# Patient Record
Sex: Female | Born: 1980 | Race: Black or African American | Hispanic: Yes | Marital: Single | Smoking: Current some day smoker
Health system: Southern US, Community
[De-identification: ages and names within clinical notes are randomized; demographics above are authoritative.]

## PROBLEM LIST (undated history)

## (undated) ENCOUNTER — Ambulatory Visit: Admission: EM | Payer: Medicaid Other

---

## 2017-04-08 ENCOUNTER — Encounter: Payer: Self-pay | Admitting: Emergency Medicine

## 2017-04-08 ENCOUNTER — Ambulatory Visit
Admission: EM | Admit: 2017-04-08 | Discharge: 2017-04-08 | Disposition: A | Discharge status: Home / Self Care | Payer: Self-pay | Attending: Family Medicine | Admitting: Family Medicine

## 2017-04-08 DIAGNOSIS — L739 Follicular disorder, unspecified: Secondary | ICD-10-CM

## 2017-04-08 DIAGNOSIS — H05012 Cellulitis of left orbit: Secondary | ICD-10-CM

## 2017-04-08 MED ORDER — MUPIROCIN CALCIUM 2 % EX CREA: 1.0000 "application " | TOPICAL_CREAM | Freq: Two times a day (BID) | CUTANEOUS | 0 refills | Status: DC

## 2017-04-08 MED ORDER — FEXOFENADINE-PSEUDOEPHED ER 180-240 MG PO TB24: 1.0000 | ORAL_TABLET | Freq: Every day | ORAL | 0 refills | Status: DC

## 2017-04-08 MED ORDER — CEFIXIME 400 MG PO CAPS: 400.0000 mg | ORAL_CAPSULE | Freq: Every day | ORAL | 0 refills | Status: DC

## 2017-04-08 NOTE — ED Provider Notes (Signed)
[facility]    CSN: [id number] Arrival date & time: 04/08/17  1116     History   Chief Complaint Chief Complaint  Patient presents with  . Eye Problem    HPI Robin Barnes is a 36 y.o. female.   H and is a 36 year old black female who states that she has swelling of the left eye on Sunday left eye is progressively gotten worse until today both eyelids were swollen and markedly painful. She states that his morning Cipro lotion but found the lotion to be irritating his skin she has a rash on both sides for cheeks and face scattered in his pinpoint lesions look like folliculitis on her face as well   The history is provided by the patient.  Eye Problem  Location:  Left eye Quality:  Burning and stinging Severity:  Moderate Onset quality:  Sudden Duration:  3 days Timing:  Constant Progression:  Worsening Chronicity:  New Relieved by:  Nothing Worsened by:  Nothing Ineffective treatments:  None tried Associated symptoms: crusting, facial rash, inflammation, itching and redness   Risk factors: previous injury to eye     History reviewed. No pertinent past medical history.  There are no active problems to display for this patient.   History reviewed. No pertinent surgical history.  OB History    No data available       Home Medications    Prior to Admission medications   Medication Sig Start Date End Date Taking? Authorizing Provider  levonorgestrel (MIRENA) 20 MCG/24HR IUD 1 each by Intrauterine route once.   Yes [provider]  Cefixime (SUPRAX) 400 MG CAPS capsule Take 1 capsule (400 mg total) by mouth daily. 04/08/17   Hassan Rowan, MD  fexofenadine-pseudoephedrine (ALLEGRA-D ALLERGY & CONGESTION) 180-240 MG 24 hr tablet Take 1 tablet by mouth daily. 04/08/17   Hassan Rowan, MD  mupirocin cream (BACTROBAN) 2 % Apply 1 application topically 2 (two) times daily. 04/08/17   Hassan Rowan, MD    Family History History reviewed. No pertinent  family history.  Social History Social History  Substance Use Topics  . Smoking status: Current Some Day Smoker    Types: Cigarettes  . Smokeless tobacco: Never Used  . Alcohol use No     Allergies   Patient has no known allergies.   Review of Systems Review of Systems  HENT: Positive for sinus pain and sinus pressure.   Eyes: Positive for redness and itching.  Skin: Positive for rash.  All other systems reviewed and are negative.    Physical Exam Triage Vital Signs ED Triage Vitals  Enc Vitals Group     BP 04/08/17 1219 128/77     Pulse Rate 04/08/17 1219 65     Resp 04/08/17 1219 16     Temp 04/08/17 1219 98.6 F (37 C)     Temp Source 04/08/17 1219 Oral     SpO2 04/08/17 1219 100 %     Weight 04/08/17 1216 190 lb (86.2 kg)     Height 04/08/17 1216 5\' 7"  (1.702 m)     Head Circumference --      Peak Flow --      Pain Score 04/08/17 1216 8     Pain Loc --      Pain Edu? --      Excl. in GC? --    No data found.   Updated Vital Signs BP 128/77 (BP Location: Left Arm)   Pulse 65   Temp  98.6 F (37 C) (Oral)   Resp 16   Ht 5\' 7"  (1.702 m)   Wt 190 lb (86.2 kg)   SpO2 100%   BMI 29.76 kg/m   Visual Acuity Right Eye Distance: 20/25 corrected Left Eye Distance: 20/30 corrected Bilateral Distance: 20/25 corrected  Right Eye Near:   Left Eye Near:    Bilateral Near:     Physical Exam  Constitutional: She appears well-developed and well-nourished.  HENT:  Head: Normocephalic.    Right Ear: Hearing, tympanic membrane, external ear and ear canal normal.  Left Ear: Hearing, tympanic membrane, external ear and ear canal normal.  Nose: Sinus tenderness present. Right sinus exhibits maxillary sinus tenderness. Right sinus exhibits no frontal sinus tenderness. Left sinus exhibits maxillary sinus tenderness. Left sinus exhibits no frontal sinus tenderness.  Mouth/Throat: Uvula is midline. No uvula swelling. No posterior oropharyngeal edema.  Patient with  marked swelling of the left eye upper and lower eyelids and redness of the left eye as well. Is also a rash underneath the left eye and the lesions on both sides for face as well which barely been pruritic and she's been scratching  Eyes: Pupils are equal, round, and reactive to light. Left eye exhibits discharge.  Neck: Normal range of motion. Neck supple.  Pulmonary/Chest: Effort normal.  Musculoskeletal: Normal range of motion.  Lymphadenopathy:    She has cervical adenopathy.  Neurological: She is alert.  Skin: Rash noted. There is erythema.  Psychiatric: She has a normal mood and affect.  Vitals reviewed.    UC Treatments / Results  Labs (all labs ordered are listed, but only abnormal results are displayed) Labs Reviewed - No data to display  EKG  EKG Interpretation None       Radiology No results found.  Procedures Procedures (including critical care time)  Medications Ordered in UC Medications - No data to display   Initial Impression / Assessment and Plan / UC Course  I have reviewed the triage vital signs and the nursing notes.  Pertinent labs & imaging results that were available during my care of the patient were reviewed by me and considered in my medical decision making (see chart for details).     1 tablet 100 if the lesions could have been a trigeminal herpetic infection but because involving both sides the face that pretty much limits that factor. Have to symptoms is probably a folliculitis patient is rubbing scratching the left eye touch menopause for face nothing she's self contaminating her face with lesions. Will going to place her on Suprax 400 mg 1 tablet day offered a shot of Rocephin which she declined and will also place him back pain ointment twice a day and Allegra-D 1 tablet daily. She did have more tenderness over the sinuses left being worse than the right consistent with cellulitis and sinus infection. Follow-up for PCP in a week if not better  work note given for today and tomorrow  Final Clinical Impressions(s) / UC Diagnoses   Final diagnoses:  Orbital cellulitis on left  Folliculitis    New Prescriptions New Prescriptions   CEFIXIME (SUPRAX) 400 MG CAPS CAPSULE    Take 1 capsule (400 mg total) by mouth daily.   FEXOFENADINE-PSEUDOEPHEDRINE (ALLEGRA-D ALLERGY & CONGESTION) 180-240 MG 24 HR TABLET    Take 1 tablet by mouth daily.   MUPIROCIN CREAM (BACTROBAN) 2 %    Apply 1 application topically 2 (two) times daily.     Hassan RowanWade, Leston Schueller, MD 04/08/17 [phone]

## 2017-04-08 NOTE — ED Triage Notes (Signed)
Patitent c/o swelling and redness in her left upper eyelid that started on Friday.

## 2017-04-08 NOTE — Discharge Instructions (Signed)
Apply the Bactroban ointment to the rash on the face twice a day as needed please to call the Suprax antibiotic until gone

## 2018-01-20 ENCOUNTER — Emergency Department
Admission: EM | Admit: 2018-01-20 | Discharge: 2018-01-20 | Disposition: A | Discharge status: Home / Self Care | Payer: Self-pay | Attending: Emergency Medicine | Admitting: Emergency Medicine

## 2018-01-20 ENCOUNTER — Other Ambulatory Visit: Payer: Self-pay

## 2018-01-20 DIAGNOSIS — Z79899 Other long term (current) drug therapy: Secondary | ICD-10-CM | POA: Insufficient documentation

## 2018-01-20 DIAGNOSIS — H109 Unspecified conjunctivitis: Secondary | ICD-10-CM | POA: Insufficient documentation

## 2018-01-20 DIAGNOSIS — F17210 Nicotine dependence, cigarettes, uncomplicated: Secondary | ICD-10-CM | POA: Insufficient documentation

## 2018-01-20 MED ORDER — CETIRIZINE HCL 10 MG PO TABS: 10.0000 mg | ORAL_TABLET | Freq: Every day | ORAL | 1 refills | Status: AC

## 2018-01-20 MED ORDER — POLYMYXIN B-TRIMETHOPRIM 10000-0.1 UNIT/ML-% OP SOLN: 1.0000 [drp] | OPHTHALMIC | 0 refills | Status: AC

## 2018-01-20 NOTE — ED Notes (Signed)
See triage note  Presents with painful and itchy eyes   Sx;s started about 3 weeks ago

## 2018-01-20 NOTE — ED Notes (Signed)
Pt has watery red itchy eyes for 3 weeks   Taking benadryl without relief.  No injury to eyes.  Pt alert.

## 2018-01-20 NOTE — ED Provider Notes (Signed)
[facility] Emergency Department Provider Note  ____________________________________________  Time seen: Approximately 5:42 PM  I have reviewed the triage vital signs and the nursing notes.   HISTORY  Chief Complaint Eye Drainage    HPI Robin Barnes is a 37 y.o. female presents to the emergency department with pruritic eyes with increased tearing and purulent exudate for the past 3 weeks.  Patient reports that symptoms started in the right eye and then occurred in the left.  She denies changes in vision, nausea or vomiting.  Patient reports that she works in a [employer]".  She has tried Benadryl, which has not relieved her symptoms.   History reviewed. No pertinent past medical history.  There are no active problems to display for this patient.   History reviewed. No pertinent surgical history.  Prior to Admission medications   Medication Sig Start Date End Date Taking? Authorizing Provider  Cefixime (SUPRAX) 400 MG CAPS capsule Take 1 capsule (400 mg total) by mouth daily. 04/08/17   Hassan Rowan, MD  cetirizine (ZYRTEC ALLERGY) 10 MG tablet Take 1 tablet (10 mg total) by mouth daily for 20 days. 01/20/18 02/09/18  Orvil Feil, PA-C  fexofenadine-pseudoephedrine (ALLEGRA-D ALLERGY & CONGESTION) 180-240 MG 24 hr tablet Take 1 tablet by mouth daily. 04/08/17   Hassan Rowan, MD  levonorgestrel (MIRENA) 20 MCG/24HR IUD 1 each by Intrauterine route once.    [provider]  mupirocin cream (BACTROBAN) 2 % Apply 1 application topically 2 (two) times daily. 04/08/17   Hassan Rowan, MD  trimethoprim-polymyxin b (POLYTRIM) ophthalmic solution Place 1 drop into both eyes every 4 (four) hours for 7 days. 01/20/18 01/27/18  Orvil Feil, PA-C    Allergies Patient has no known allergies.  No family history on file.  Social History Social History   Tobacco Use  . Smoking status: Current Some Day Smoker    Types: Cigarettes  . Smokeless tobacco:  Never Used  Substance Use Topics  . Alcohol use: No  . Drug use: Not on file     Review of Systems  Constitutional: No fever/chills Eyes: Patient has bilateral conjunctivitis  ENT: No upper respiratory complaints. Cardiovascular: no chest pain. Respiratory: no cough. No SOB. Gastrointestinal: No abdominal pain.  No nausea, no vomiting.  No diarrhea.  No constipation. Musculoskeletal: Negative for musculoskeletal pain. Skin: Negative for rash, abrasions, lacerations, ecchymosis. Neurological: Negative for headaches, focal weakness or numbness.  ____________________________________________   PHYSICAL EXAM:  VITAL SIGNS: ED Triage Vitals [01/20/18 1542]  Enc Vitals Group     BP 126/70     Pulse Rate 93     Resp 18     Temp 98.6 F (37 C)     Temp Source Oral     SpO2 98 %     Weight 192 lb (87.1 kg)     Height 5\' 8"  (1.727 m)     Head Circumference      Peak Flow      Pain Score 8     Pain Loc      Pain Edu?      Excl. in GC?      Constitutional: Alert and oriented. Well appearing and in no acute distress. Eyes: Patient has increased tearing with bilateral conjunctivitis and crusting within the eyelashes.  PERRL. EOMI. Head: Atraumatic. ENT:      Ears: TMs are pearly bilaterally.      Nose: No congestion/rhinnorhea.      Mouth/Throat: Mucous membranes are moist.  Cardiovascular: Normal rate, regular rhythm. Normal S1 and S2.  Good peripheral circulation. Respiratory: Normal respiratory effort without tachypnea or retractions. Lungs CTAB. Good air entry to the bases with no decreased or absent breath sounds. Musculoskeletal: Full range of motion to all extremities. No gross deformities appreciated. Neurologic:  Normal speech and language. No gross focal neurologic deficits are appreciated.  Skin:  Skin is warm, dry and intact. No rash noted.  ____________________________________________   LABS (all labs ordered are listed, but only abnormal results are  displayed)  Labs Reviewed - No data to display ____________________________________________  EKG   ____________________________________________  RADIOLOGY   No results found.  ____________________________________________    PROCEDURES  Procedure(s) performed:    Procedures    Medications - No data to display   ____________________________________________   INITIAL IMPRESSION / ASSESSMENT AND PLAN / ED COURSE  Pertinent labs & imaging results that were available during my care of the patient were reviewed by me and considered in my medical decision making (see chart for details).  Review of the [place] CSRS was performed in accordance of the [facility] prior to dispensing any controlled drugs.     Assessment and plan Conjunctivitis:  Differential diagnosis included allergic conjunctivitis, bacterial conjunctivitis and viral URI.  History and physical exam findings are consistent with bacterial conjunctivitis.  Patient was discharged with Polytrim and advised to follow-up with primary care.  All patient questions were answered.     ____________________________________________  FINAL CLINICAL IMPRESSION(S) / ED DIAGNOSES  Final diagnoses:  Conjunctivitis of both eyes, unspecified conjunctivitis type      NEW MEDICATIONS STARTED DURING THIS VISIT:  ED Discharge Orders        Ordered    trimethoprim-polymyxin b (POLYTRIM) ophthalmic solution  Every 4 hours     01/20/18 1714    cetirizine (ZYRTEC ALLERGY) 10 MG tablet  Daily     01/20/18 1714          This chart was dictated using voice recognition software/Dragon. Despite best efforts to proofread, errors can occur which can change the meaning. Any change was purely unintentional.    Orvil FeilWoods, Reuven Braver M, PA-C 01/20/18 1745    Don PerkingVeronese, [place], MD 01/21/18 1536

## 2018-01-20 NOTE — ED Triage Notes (Signed)
Bilateral eye drainage and itching for 3 weeks. Pt alert and oriented X4, active, cooperative, pt in NAD. RR even and unlabored, color WNL.

## 2019-07-16 ENCOUNTER — Ambulatory Visit
Admission: EM | Admit: 2019-07-16 | Discharge: 2019-07-16 | Disposition: A | Discharge status: Home / Self Care | Payer: Medicaid Other | Attending: Emergency Medicine | Admitting: Emergency Medicine

## 2019-07-16 ENCOUNTER — Encounter: Payer: Self-pay | Admitting: Emergency Medicine

## 2019-07-16 ENCOUNTER — Other Ambulatory Visit: Payer: Self-pay

## 2019-07-16 DIAGNOSIS — X500XXA Overexertion from strenuous movement or load, initial encounter: Secondary | ICD-10-CM

## 2019-07-16 DIAGNOSIS — S39012A Strain of muscle, fascia and tendon of lower back, initial encounter: Secondary | ICD-10-CM | POA: Diagnosis not present

## 2019-07-16 MED ORDER — MELOXICAM 7.5 MG PO TABS: 7.5000 mg | ORAL_TABLET | Freq: Two times a day (BID) | ORAL | 0 refills | Status: AC

## 2019-07-16 MED ORDER — METAXALONE 800 MG PO TABS: 800.0000 mg | ORAL_TABLET | Freq: Three times a day (TID) | ORAL | 0 refills | Status: AC

## 2019-07-16 NOTE — Discharge Instructions (Signed)
Use  Caution while taking muscle relaxers.  Do not perform activities requiring concentration or judgment and do not drive. Apply ice 20 minutes out of every 2 hours 4-5 times daily for comfort.  °

## 2019-07-16 NOTE — ED Provider Notes (Signed)
[facility]    CSN: [id number] Arrival date & time: 07/16/19  0850      History   Chief Complaint Chief Complaint  Patient presents with  . Back Pain    HPI Elder Negusnita Alling is a 38 y.o. female.   HPI  38 year old female presents with sudden onset of low back pain when she was attempting to lift her 7840 pound 147-year-old son into a car seat without bending.  She states that the pain happened immediately.  It extends into the right buttock to the inferior fold.  Does not extend beyond that on her leg.  She has had no numbness or tingling.  She denies any urinary or bladder dysfunction.  Not her injured her back in the past.  Has been using ibuprofen and CBD oil but with very minimal relief.  He states almost any position is very painful particularly when she sits on the toilet or bends forward.        History reviewed. No pertinent past medical history.  There are no active problems to display for this patient.   History reviewed. No pertinent surgical history.  OB History   No obstetric history on file.      Home Medications    Prior to Admission medications   Medication Sig Start Date End Date Taking? Authorizing Provider  levonorgestrel (MIRENA) 20 MCG/24HR IUD 1 each by Intrauterine route once.   Yes [provider]  cetirizine (ZYRTEC ALLERGY) 10 MG tablet Take 1 tablet (10 mg total) by mouth daily for 20 days. 01/20/18 02/09/18  Orvil FeilWoods, Jaclyn M, PA-C  meloxicam (MOBIC) 7.5 MG tablet Take 1 tablet (7.5 mg total) by mouth 2 (two) times daily before a meal. 07/16/19   Lutricia Feiloemer, Mallorey Odonell P, PA-C  metaxalone (SKELAXIN) 800 MG tablet Take 1 tablet (800 mg total) by mouth 3 (three) times daily. 07/16/19   Lutricia Feiloemer, Danthony Kendrix P, PA-C    Family History Family History  Problem Relation Age of Onset  . Diabetes Mother     Social History Social History   Tobacco Use  . Smoking status: Current Some Day Smoker    Types: Cigarettes  . Smokeless tobacco: Never  Used  Substance Use Topics  . Alcohol use: No  . Drug use: Never     Allergies   Patient has no known allergies.   Review of Systems Review of Systems  Constitutional: Positive for activity change. Negative for appetite change, chills, diaphoresis, fatigue and fever.  Musculoskeletal: Positive for back pain and myalgias.  All other systems reviewed and are negative.    Physical Exam Triage Vital Signs ED Triage Vitals  Enc Vitals Group     BP 07/16/19 0910 102/83     Pulse Rate 07/16/19 0910 78     Resp 07/16/19 0910 16     Temp 07/16/19 0910 98.2 F (36.8 C)     Temp Source 07/16/19 0910 Oral     SpO2 07/16/19 0910 99 %     Weight 07/16/19 0905 223 lb (101.2 kg)     Height 07/16/19 0905 5' 7.5" (1.715 m)     Head Circumference --      Peak Flow --      Pain Score 07/16/19 0904 10     Pain Loc --      Pain Edu? --      Excl. in GC? --    No data found.  Updated Vital Signs BP 102/83 (BP Location: Right Arm)   Pulse  78   Temp 98.2 F (36.8 C) (Oral)   Resp 16   Ht 5' 7.5" (1.715 m)   Wt 223 lb (101.2 kg)   SpO2 99%   BMI 34.41 kg/m   Visual Acuity Right Eye Distance:   Left Eye Distance:   Bilateral Distance:    Right Eye Near:   Left Eye Near:    Bilateral Near:     Physical Exam Vitals signs and nursing note reviewed. Exam conducted with a chaperone present.  Constitutional:      General: She is not in acute distress.    Appearance: Normal appearance. She is obese. She is not ill-appearing, toxic-appearing or diaphoretic.  HENT:     Head: Normocephalic and atraumatic.     Nose: Nose normal.     Mouth/Throat:     Mouth: Mucous membranes are moist.  Eyes:     Conjunctiva/sclera: Conjunctivae normal.     Pupils: Pupils are equal, round, and reactive to light.  Neck:     Musculoskeletal: Normal range of motion and neck supple.  Musculoskeletal:        General: Tenderness and signs of injury present. No swelling or deformity.     Comments:  Examination of lumbar spine shows a level pelvis in stance.  Is able to forward flex only to approximately 30degrees.  Lateral flexion to the right to 15 degrees to the left is very blunted due to pain and blunting of the lumbar curve.  Maximum tenderness is in the lower lumbar segments left and right of the midline most noticeable on the left.  Patient is able to toe and heel walk normally.  There is no hip esthesia to light touch distally.  Straight leg raise testing is negative at 90 degrees in the seated position.  DTRs are 2+/4 and symmetrical.  Skin:    General: Skin is warm and dry.  Neurological:     General: No focal deficit present.     Mental Status: She is alert and oriented to person, place, and time.     Sensory: No sensory deficit.     Motor: No weakness.     Coordination: Coordination normal.     Gait: Gait abnormal.     Deep Tendon Reflexes: Reflexes normal.  Psychiatric:        Mood and Affect: Mood normal.        Behavior: Behavior normal.        Thought Content: Thought content normal.        Judgment: Judgment normal.      UC Treatments / Results  Labs (all labs ordered are listed, but only abnormal results are displayed) Labs Reviewed - No data to display  EKG   Radiology No results found.  Procedures Procedures (including critical care time)  Medications Ordered in UC Medications - No data to display  Initial Impression / Assessment and Plan / UC Course  I have reviewed the triage vital signs and the nursing notes.  Pertinent labs & imaging results that were available during my care of the patient were reviewed by me and considered in my medical decision making (see chart for details).   Patient sustained a lumbar strain.  Refused injection of Toradol.  She will apply ice  20 minutes out of every 2 hours 5 times daily for comfort.  She may use blue emu or Biofreeze as necessary for comfort.  Place her on Skelaxin with appropriate precautions and Mobic  7.5 mg twice daily.  She should follow-up with her primary care physician if she is not improving in a week or so.   Final Clinical Impressions(s) / UC Diagnoses   Final diagnoses:  Strain of lumbar region, initial encounter     Discharge Instructions     Use  Caution while taking muscle relaxers.  Do not perform activities requiring concentration or judgment and do not drive. Apply ice 20 minutes out of every 2 hours 4-5 times daily for comfort.     ED Prescriptions    Medication Sig Dispense Auth. Provider   metaxalone (SKELAXIN) 800 MG tablet Take 1 tablet (800 mg total) by mouth 3 (three) times daily. 21 tablet Ovid Curd P, PA-C   meloxicam (MOBIC) 7.5 MG tablet Take 1 tablet (7.5 mg total) by mouth 2 (two) times daily before a meal. 30 tablet Lutricia Feil, PA-C     Controlled Substance Prescriptions [place] Controlled Substance Registry consulted? Not Applicable   Lutricia Feil, PA-C 07/16/19 1823

## 2019-07-16 NOTE — ED Triage Notes (Signed)
Patient states that she was trying to put her 36 year old son in the car yesterday and felt a pain in her lower back.  Patient c/o ongoing pain in her lower back.

## 2021-02-17 ENCOUNTER — Emergency Department: Payer: Medicaid Other

## 2021-02-17 ENCOUNTER — Emergency Department
Admission: EM | Admit: 2021-02-17 | Discharge: 2021-02-17 | Disposition: A | Discharge status: Home / Self Care | Payer: Medicaid Other | Attending: Emergency Medicine | Admitting: Emergency Medicine

## 2021-02-17 ENCOUNTER — Telehealth: Payer: Self-pay | Admitting: Physician Assistant

## 2021-02-17 ENCOUNTER — Encounter: Payer: Self-pay | Admitting: Emergency Medicine

## 2021-02-17 ENCOUNTER — Other Ambulatory Visit: Payer: Self-pay

## 2021-02-17 DIAGNOSIS — Y9389 Activity, other specified: Secondary | ICD-10-CM | POA: Diagnosis not present

## 2021-02-17 DIAGNOSIS — S39012A Strain of muscle, fascia and tendon of lower back, initial encounter: Secondary | ICD-10-CM | POA: Diagnosis not present

## 2021-02-17 DIAGNOSIS — Y990 Civilian activity done for income or pay: Secondary | ICD-10-CM | POA: Diagnosis not present

## 2021-02-17 DIAGNOSIS — X500XXA Overexertion from strenuous movement or load, initial encounter: Secondary | ICD-10-CM | POA: Insufficient documentation

## 2021-02-17 DIAGNOSIS — F17210 Nicotine dependence, cigarettes, uncomplicated: Secondary | ICD-10-CM | POA: Insufficient documentation

## 2021-02-17 DIAGNOSIS — S3992XA Unspecified injury of lower back, initial encounter: Secondary | ICD-10-CM | POA: Diagnosis present

## 2021-02-17 LAB — POC URINE PREG, ED: Preg Test, Ur: NEGATIVE

## 2021-02-17 MED ORDER — KETOROLAC TROMETHAMINE 30 MG/ML IJ SOLN
30.0000 mg | Freq: Once | INTRAMUSCULAR | Status: AC
Start: 2021-02-17 — End: 2021-02-17
  Administered 2021-02-17: 30 mg via INTRAMUSCULAR
  Filled 2021-02-17: qty 1

## 2021-02-17 MED ORDER — ORPHENADRINE CITRATE ER 100 MG PO TB12: 100.0000 mg | ORAL_TABLET | Freq: Two times a day (BID) | ORAL | 0 refills | Status: AC

## 2021-02-17 MED ORDER — BACLOFEN 10 MG PO TABS: 10.0000 mg | ORAL_TABLET | Freq: Three times a day (TID) | ORAL | 0 refills | Status: AC

## 2021-02-17 MED ORDER — HYDROMORPHONE HCL 1 MG/ML IJ SOLN
1.0000 mg | Freq: Once | INTRAMUSCULAR | Status: AC
  Administered 2021-02-17: 1 mg via INTRAMUSCULAR
  Filled 2021-02-17: qty 1

## 2021-02-17 MED ORDER — OXYCODONE-ACETAMINOPHEN 7.5-325 MG PO TABS: 1.0000 | ORAL_TABLET | Freq: Four times a day (QID) | ORAL | 0 refills | Status: AC | PRN

## 2021-02-17 MED ORDER — ORPHENADRINE CITRATE 30 MG/ML IJ SOLN
60.0000 mg | Freq: Two times a day (BID) | INTRAMUSCULAR | Status: DC
Start: 2021-02-17 — End: 2021-02-17
  Administered 2021-02-17: 60 mg via INTRAMUSCULAR
  Filled 2021-02-17: qty 2

## 2021-02-17 MED ORDER — KETOROLAC TROMETHAMINE 10 MG PO TABS: 10.0000 mg | ORAL_TABLET | Freq: Four times a day (QID) | ORAL | 0 refills | Status: AC | PRN

## 2021-02-17 NOTE — ED Triage Notes (Signed)
Pt reports lower back pain for two weeks. Pt states has had a back injury before and stands at work all day and moves a lot so is very strenuous on her back so she would like and xray.

## 2021-02-17 NOTE — ED Provider Notes (Signed)
[facility] Emergency Department Provider Note   ____________________________________________   Event Date/Time   First MD Initiated Contact with Patient 02/17/21 1132     (approximate)  I have reviewed the triage vital signs and the nursing notes.   HISTORY  Chief Complaint Back Pain    HPI Robin Barnes is a 40 y.o. female patient complain of nontraumatic back pain for 2 weeks.  Patient states her job requires repetitive heavy lifting and standing.  Patient has a history of lumbar strain 2 years ago.  Patient was concerned so there are no images done on her previous back injury.  Requested x-rays of the lumbar spine today.  Patient denies radicular component to back pain.  Rates pain as 8/10.  Described pain as "achy".  No palliative measure for complaint.         History reviewed. No pertinent past medical history.  There are no problems to display for this patient.   History reviewed. No pertinent surgical history.  Prior to Admission medications   Medication Sig Start Date End Date Taking? Authorizing Provider  ketorolac (TORADOL) 10 MG tablet Take 1 tablet (10 mg total) by mouth every 6 (six) hours as needed. 02/17/21  Yes Joni Reining, PA-C  orphenadrine (NORFLEX) 100 MG tablet Take 1 tablet (100 mg total) by mouth 2 (two) times daily. 02/17/21  Yes Joni Reining, PA-C  oxyCODONE-acetaminophen (PERCOCET) 7.5-325 MG tablet Take 1 tablet by mouth every 6 (six) hours as needed for up to 3 days for severe pain. 02/17/21 02/20/21 Yes Joni Reining, PA-C  cetirizine (ZYRTEC ALLERGY) 10 MG tablet Take 1 tablet (10 mg total) by mouth daily for 20 days. 01/20/18 02/09/18  Orvil Feil, PA-C  levonorgestrel (MIRENA) 20 MCG/24HR IUD 1 each by Intrauterine route once.    [provider]  meloxicam (MOBIC) 7.5 MG tablet Take 1 tablet (7.5 mg total) by mouth 2 (two) times daily before a meal. 07/16/19   Lutricia Feil, PA-C  metaxalone (SKELAXIN)  800 MG tablet Take 1 tablet (800 mg total) by mouth 3 (three) times daily. 07/16/19   Lutricia Feil, PA-C    Allergies Patient has no known allergies.  Family History  Problem Relation Age of Onset  . Diabetes Mother     Social History Social History   Tobacco Use  . Smoking status: Current Some Day Smoker    Types: Cigarettes  . Smokeless tobacco: Never Used  Vaping Use  . Vaping Use: Never used  Substance Use Topics  . Alcohol use: No  . Drug use: Never    Review of Systems Constitutional: No fever/chills Eyes: No visual changes. ENT: No sore throat. Cardiovascular: Denies chest pain. Respiratory: Denies shortness of breath. Gastrointestinal: No abdominal pain.  No nausea, no vomiting.  No diarrhea.  No constipation. Genitourinary: Negative for dysuria. Musculoskeletal: Bilateral back pain. Skin: Negative for rash. Neurological: Negative for headaches, focal weakness or numbness.   ____________________________________________   PHYSICAL EXAM:  VITAL SIGNS: ED Triage Vitals  Enc Vitals Group     BP 02/17/21 1126 115/70     Pulse Rate 02/17/21 1126 85     Resp --      Temp --      Temp src --      SpO2 02/17/21 1126 98 %     Weight --      Height --      Head Circumference --      Peak  Flow --      Pain Score 02/17/21 1106 8     Pain Loc --      Pain Edu? --      Excl. in GC? --    Constitutional: Alert and oriented. Well appearing and in no acute distress. Neck: No cervical spine tenderness to palpation. Hematological/Lymphatic/Immunilogical: No cervical lymphadenopathy. Cardiovascular: Normal rate, regular rhythm. Grossly normal heart sounds.  Good peripheral circulation. Respiratory: Normal respiratory effort.  No retractions. Lungs CTAB. Gastrointestinal: Soft and nontender. No distention. No abdominal bruits. No CVA tenderness. Genitourinary: Deferred Musculoskeletal: Patient refused to sit.  No obvious lumbar spine deformity.  Patient is  moderate guarding palpation bilateral spinal muscles.  Bilateral still spasm with lateral movements.  No lower extremity tenderness nor edema.  No joint effusions. Neurologic:  Normal speech and language. No gross focal neurologic deficits are appreciated. No gait instability. Skin:  Skin is warm, dry and intact. No rash noted. Psychiatric: Mood and affect are normal. Speech and behavior are normal.  ____________________________________________   LABS (all labs ordered are listed, but only abnormal results are displayed)  Labs Reviewed  POC URINE PREG, ED   ____________________________________________  EKG   ____________________________________________  RADIOLOGY I, Joni Reining, personally viewed and evaluated these images (plain radiographs) as part of my medical decision making, as well as reviewing the written report by the radiologist.  ED MD interpretation: No acute findings on x-ray of the lumbar spine. Official radiology report(s): DG Lumbar Spine 2-3 Views  Result Date: 02/17/2021 CLINICAL DATA:  Low back pain for 2 weeks. EXAM: LUMBAR SPINE - 2-3 VIEW COMPARISON:  None. FINDINGS: There are 5 lumbar type vertebral bodies. The alignment is normal. The disc spaces are preserved. There is mild intervertebral spurring, but no evidence of acute fracture, pars defect or significant facet arthropathy. Intrauterine device and bilateral pelvic phleboliths are noted. IMPRESSION: No acute lumbar spine findings.  Minimal spondylosis. Electronically Signed   By: Carey Bullocks M.D.   On: 02/17/2021 12:46    ____________________________________________   PROCEDURES  Procedure(s) performed (including Critical Care):  Procedures   ____________________________________________   INITIAL IMPRESSION / ASSESSMENT AND PLAN / ED COURSE  As part of my medical decision making, I reviewed the following data within the electronic MEDICAL RECORD NUMBER         Patient presents with 2  weeks of bilateral back pain.  Discussed no acute findings on x-ray.  Patient was able to lay MD supine position status post Norflex, Dilaudid, and Toradol.  Patient had negative straight leg test.  Patient complaining physical exam consistent with lumbar strain.  Patient given discharge care instruction.  Patient advised to take medication as directed.  Patient given a work note and advised to purchase elastic lumbar support when working.  Return to ED if condition worsens.      ____________________________________________   FINAL CLINICAL IMPRESSION(S) / ED DIAGNOSES  Final diagnoses:  Strain of lumbar region, initial encounter     ED Discharge Orders         Ordered    orphenadrine (NORFLEX) 100 MG tablet  2 times daily        02/17/21 1306    ketorolac (TORADOL) 10 MG tablet  Every 6 hours PRN        02/17/21 1306    oxyCODONE-acetaminophen (PERCOCET) 7.5-325 MG tablet  Every 6 hours PRN        02/17/21 1306          *Please  note:  Robin Barnes was evaluated in Emergency Department on 02/17/2021 for the symptoms described in the history of present illness. She was evaluated in the context of the global COVID-19 pandemic, which necessitated consideration that the patient might be at risk for infection with the SARS-CoV-2 virus that causes COVID-19. Institutional protocols and algorithms that pertain to the evaluation of patients at risk for COVID-19 are in a state of rapid change based on information released by regulatory bodies including the CDC and federal and state organizations. These policies and algorithms were followed during the patient's care in the ED.  Some ED evaluations and interventions may be delayed as a result of limited staffing during and the pandemic.*   Note:  This document was prepared using Dragon voice recognition software and may include unintentional dictation errors.    Joni Reining, PA-C 02/17/21 1331    Sharyn Creamer, MD 02/19/21 1135

## 2021-02-17 NOTE — Telephone Encounter (Cosign Needed)
[facility] pharmacy called to say that the patient's Medicaid will not pay for Norflex.  They need a prior authorization.  Send in a prescription for baclofen.  Splane to the pharmacist if this does not work she can call back we will try another muscle relaxer.

## 2021-02-17 NOTE — Discharge Instructions (Addendum)
No acute findings on x-ray lumbar spine.  Read and follow discharge care instruction.  Advised an elastic lumbar support when working.  Be advised most relaxant pain medication may cause drowsiness.

## 2021-10-15 ENCOUNTER — Other Ambulatory Visit: Payer: Self-pay

## 2021-10-15 ENCOUNTER — Ambulatory Visit: Payer: Medicaid Other

## 2021-10-15 NOTE — Progress Notes (Unsigned)
Pt did not show for scheduled appointment.  

## 2022-02-26 DIAGNOSIS — R69 Illness, unspecified: Secondary | ICD-10-CM | POA: Diagnosis not present

## 2022-02-26 DIAGNOSIS — S46811A Strain of other muscles, fascia and tendons at shoulder and upper arm level, right arm, initial encounter: Secondary | ICD-10-CM | POA: Diagnosis not present

## 2022-02-26 DIAGNOSIS — M25511 Pain in right shoulder: Secondary | ICD-10-CM | POA: Diagnosis not present

## 2022-02-26 DIAGNOSIS — R202 Paresthesia of skin: Secondary | ICD-10-CM | POA: Diagnosis not present

## 2022-02-26 DIAGNOSIS — M542 Cervicalgia: Secondary | ICD-10-CM | POA: Diagnosis not present

## 2022-09-06 IMAGING — CR DG LUMBAR SPINE 2-3V
1 series · 3 of 3 positions shown · non-contrast
Comparison: None.

CLINICAL DATA: Low back pain for 2 weeks.

EXAM:
LUMBAR SPINE - 2-3 VIEW

[Series 1: dg lumbar spine 2-3 views · 0.14mm/px · 3 of 3 slices shown]
[im 1/3]
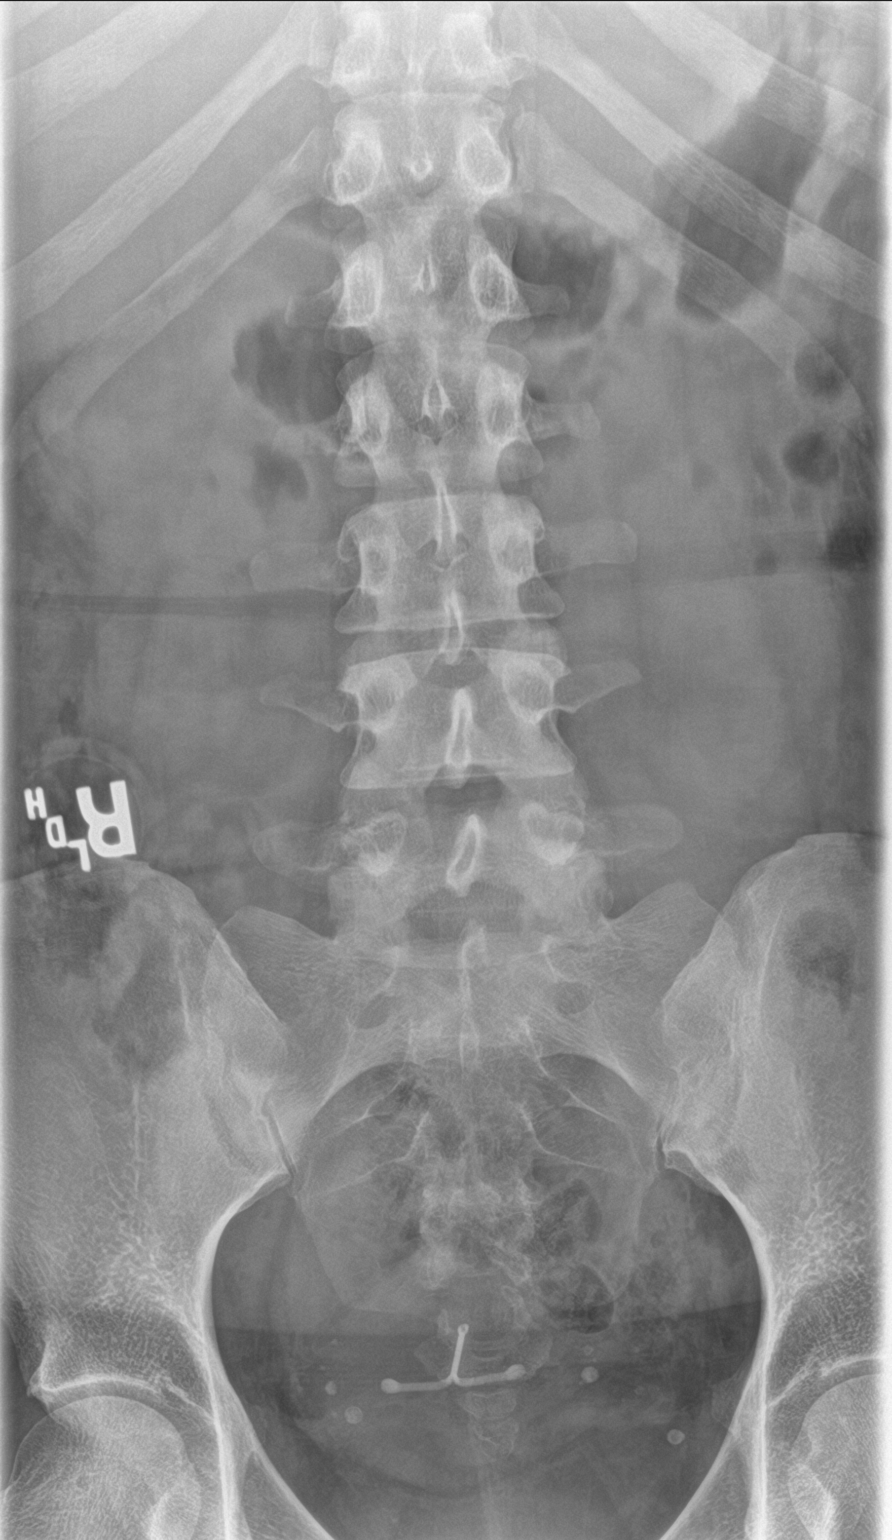
[im 2/3]
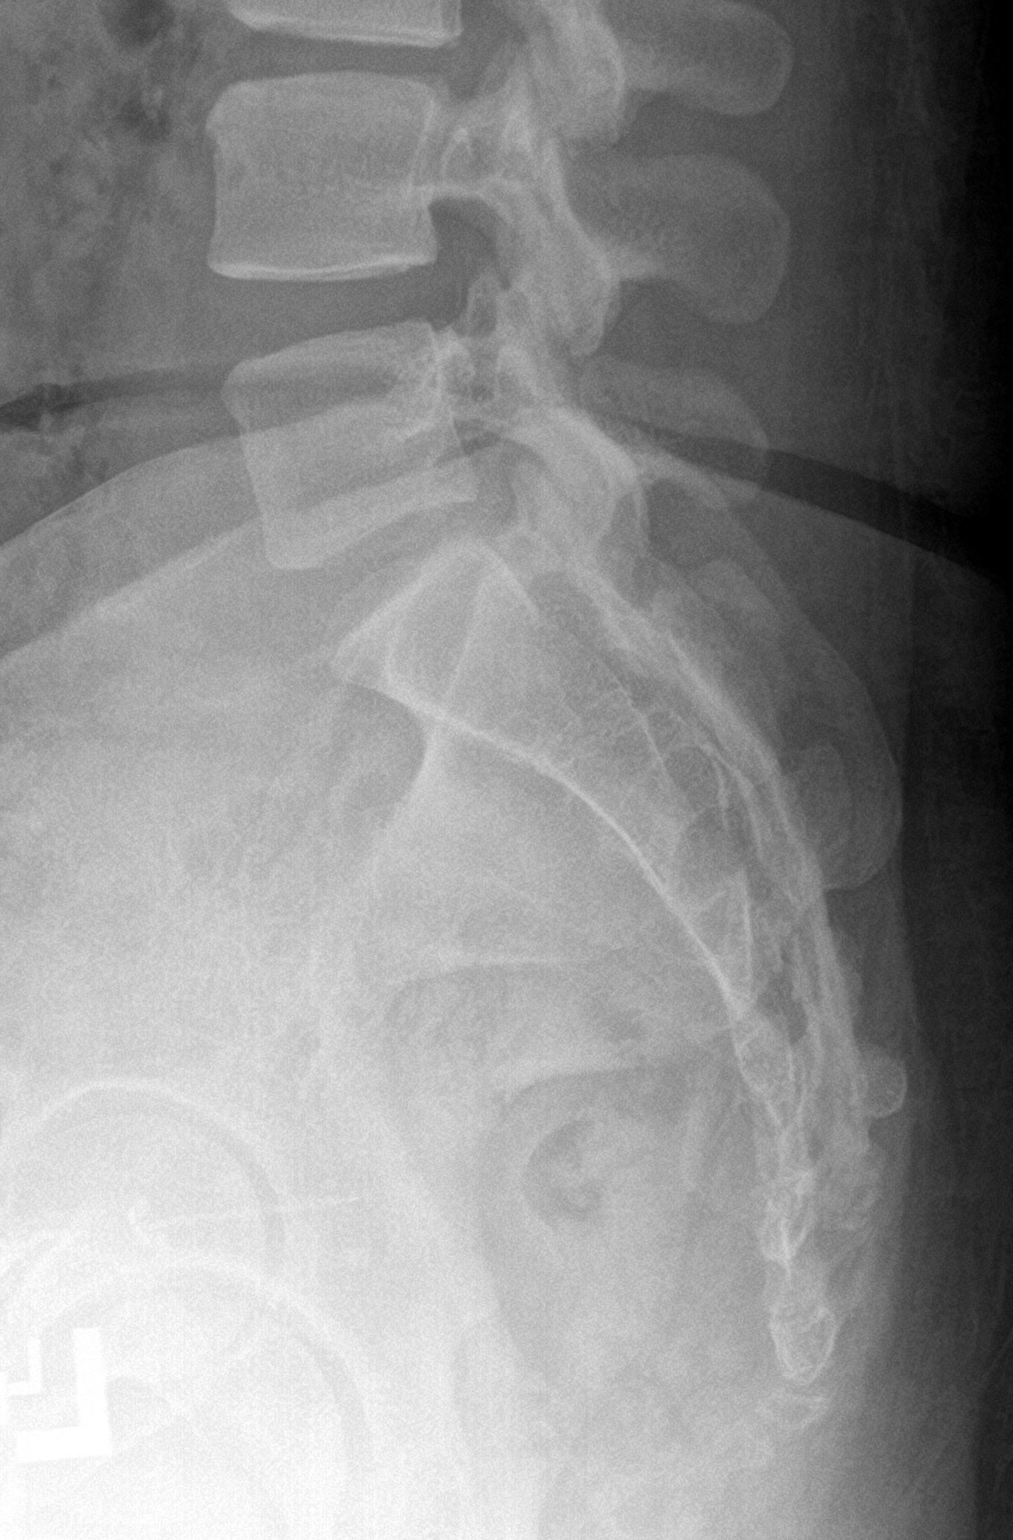
[im 3/3]
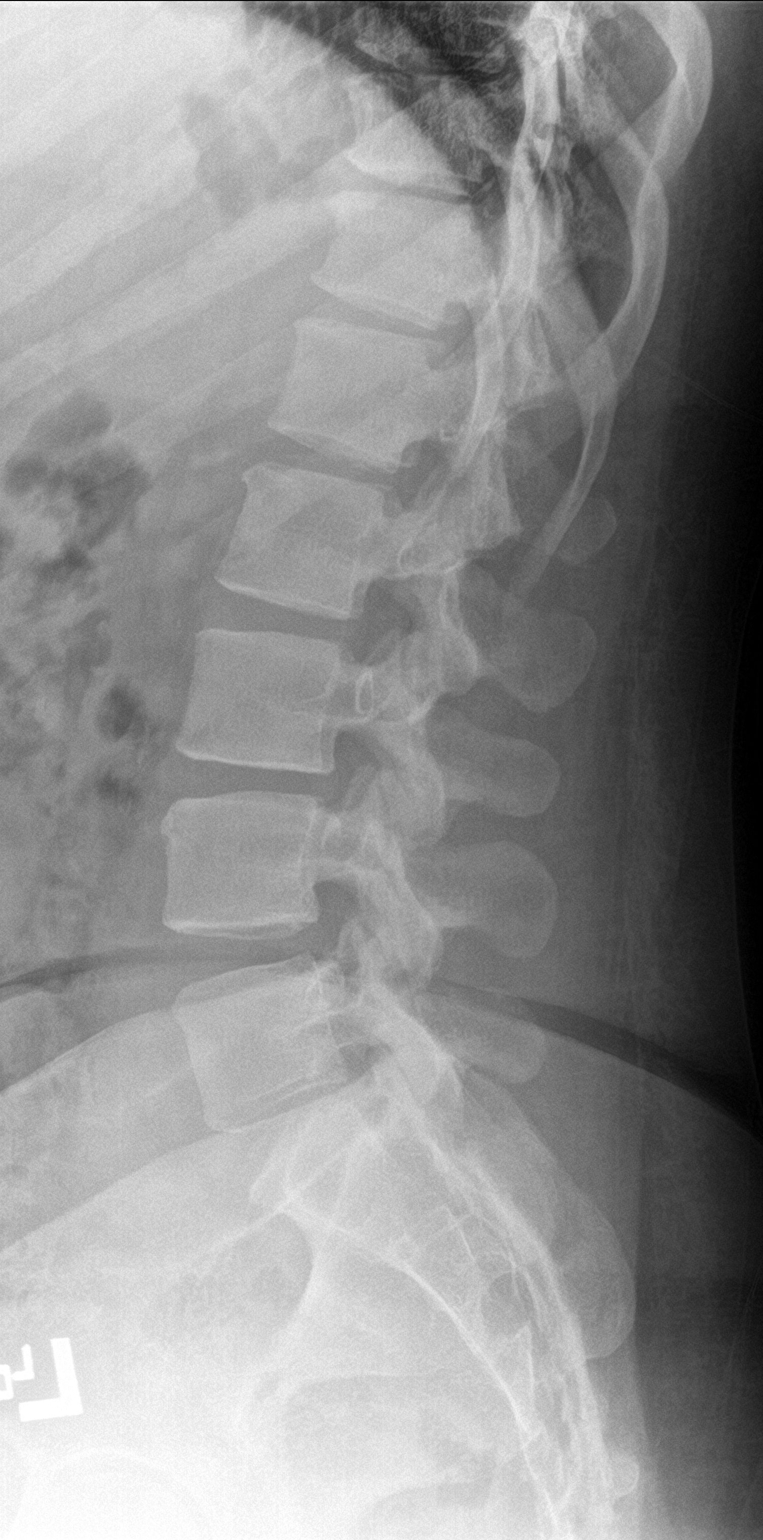

[3 of 3 positions shown; findings below may reference images not displayed]

FINDINGS: There are 5 lumbar type vertebral bodies. The alignment is normal.
The disc spaces are preserved. There is mild intervertebral
spurring, but no evidence of acute fracture, pars defect or
significant facet arthropathy. Intrauterine device and bilateral
pelvic phleboliths are noted.
IMPRESSION: No acute lumbar spine findings.  Minimal spondylosis.
